# Patient Record
Sex: Male | Born: 1995 | Race: White | Hispanic: No | Marital: Single | Smoking: Former smoker
Health system: Southern US, Community
[De-identification: ages and names within clinical notes are randomized; demographics above are authoritative.]

## PROBLEM LIST (undated history)

## (undated) DIAGNOSIS — F909 Attention-deficit hyperactivity disorder, unspecified type: Secondary | ICD-10-CM

## (undated) HISTORY — DX: Attention-deficit hyperactivity disorder, unspecified type: F90.9

---
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
## 2014-02-17 ENCOUNTER — Encounter (HOSPITAL_COMMUNITY): Payer: Self-pay | Admitting: Emergency Medicine

## 2014-02-17 ENCOUNTER — Emergency Department (HOSPITAL_COMMUNITY)
Admission: EM | Admit: 2014-02-17 | Discharge: 2014-02-17 | Disposition: A | Discharge status: Home / Self Care | Payer: 59 | Source: Home / Self Care | Attending: Family Medicine | Admitting: Family Medicine

## 2014-02-17 DIAGNOSIS — F988 Other specified behavioral and emotional disorders with onset usually occurring in childhood and adolescence: Secondary | ICD-10-CM

## 2014-02-17 NOTE — Discharge Instructions (Signed)
Thank you for coming in today. Follow up with Dr. Conley RollsLe at the [address] location on Friday at 11:15 am. Phone number is [phone] Try to get records from your old doctor's office about your ADHD.    Attention Deficit Hyperactivity Disorder Attention deficit hyperactivity disorder (ADHD) is a problem with behavior issues based on the way the brain functions (neurobehavioral disorder). It is a common reason for behavior and academic problems in school. SYMPTOMS  There are 3 types of ADHD. The 3 types and some of the symptoms include:  Inattentive  Gets bored or distracted easily.  Loses or forgets things. Forgets to hand in homework.  Has trouble organizing or completing tasks.  Difficulty staying on task.  An inability to organize daily tasks and school work.  Leaving projects, chores, or homework unfinished.  Trouble paying attention or responding to details. Careless mistakes.  Difficulty following directions. Often seems like is not listening.  Dislikes activities that require sustained attention (like chores or homework).  Hyperactive-impulsive  Feels like it is impossible to sit still or stay in a seat. Fidgeting with hands and feet.  Trouble waiting turn.  Talking too much or out of turn. Interruptive.  Speaks or acts impulsively.  Aggressive, disruptive behavior.  Constantly busy or on the go, noisy.  Often leaves seat when they are expected to remain seated.  Often runs or climbs where it is not appropriate, or feels very restless.  Combined  Has symptoms of both of the above. Often children with ADHD feel discouraged about themselves and with school. They often perform well below their abilities in school. As children get older, the excess motor activities can calm down, but the problems with paying attention and staying organized persist. Most children do not outgrow ADHD but with good treatment can learn to cope with the symptoms. DIAGNOSIS    When ADHD is suspected, the diagnosis should be made by professionals trained in ADHD. This professional will collect information about the individual suspected of having ADHD. Information must be collected from various settings where the person lives, works, or attends school.  Diagnosis will include:  Confirming symptoms began in childhood.  Ruling out other reasons for the child's behavior.  The health care providers will check with the child's school and check their medical records.  They will talk to teachers and parents.  Behavior rating scales for the child will be filled out by those dealing with the child on a daily basis. A diagnosis is made only after all information has been considered. TREATMENT  Treatment usually includes behavioral treatment, tutoring or extra support in school, and stimulant medicines. Because of the way a person's brain works with ADHD, these medicines decrease impulsivity and hyperactivity and increase attention. This is different than how they would work in a person who does not have ADHD. Other medicines used include antidepressants and certain blood pressure medicines. Most experts agree that treatment for ADHD should address all aspects of the person's functioning. Along with medicines, treatment should include structured classroom management at school. Parents should reward good behavior, provide constant discipline, and limit-setting. Tutoring should be available for the child as needed. ADHD is a life-long condition. If untreated, the disorder can have long-term serious effects into adolescence and adulthood. HOME CARE INSTRUCTIONS   Often with ADHD there is a lot of frustration among family members dealing with the condition. Blame and anger are also feelings that are common. In many cases, because the problem affects the family as  a whole, the entire family may need help. A therapist can help the family find better ways to handle the disruptive  behaviors of the person with ADHD and promote change. If the person with ADHD is young, most of the therapist's work is with the parents. Parents will learn techniques for coping with and improving their child's behavior. Sometimes only the child with the ADHD needs counseling. Your health care providers can help you make these decisions.  Children with ADHD may need help learning how to organize. Some helpful tips include:  Keep routines the same every day from wake-up time to bedtime. Schedule all activities, including homework and playtime. Keep the schedule in a place where the person with ADHD will often see it. Mark schedule changes as far in advance as possible.  Schedule outdoor and indoor recreation.  Have a place for everything and keep everything in its place. This includes clothing, backpacks, and school supplies.  Encourage writing down assignments and bringing home needed books. Work with your child's teachers for assistance in organizing school work.  Offer your child a well-balanced diet. Breakfast that includes a balance of whole grains, protein and, fruits or vegetables is especially important for school performance. Children should avoid drinks with caffeine including:  Soft drinks.  Coffee.  Tea.  However, some older children (adolescents) may find these drinks helpful in improving their attention. Because it can also be common for adolescents with ADHD to become addicted to caffeine, talk with your health care provider about what is a safe amount of caffeine intake for your child.  Children with ADHD need consistent rules that they can understand and follow. If rules are followed, give small rewards. Children with ADHD often receive, and expect, criticism. Look for good behavior and praise it. Set realistic goals. Give clear instructions. Look for activities that can foster success and self-esteem. Make time for pleasant activities with your child. Give lots of  affection.  Parents are their children's greatest advocates. Learn as much as possible about ADHD. This helps you become a stronger and better advocate for your child. It also helps you educate your child's teachers and instructors if they feel inadequate in these areas. Parent support groups are often helpful. A national group with local chapters is called Children and Adults with Attention Deficit Hyperactivity Disorder (CHADD). SEEK MEDICAL CARE IF:  Your child has repeated muscle twitches, cough or speech outbursts.  Your child has sleep problems.  Your child has a marked loss of appetite.  Your child develops depression.  Your child has new or worsening behavioral problems.  Your child develops dizziness.  Your child has a racing heart.  Your child has stomach pains.  Your child develops headaches. SEEK IMMEDIATE MEDICAL CARE IF:  Your child has been diagnosed with depression or anxiety and the symptoms seem to be getting worse.  Your child has been depressed and suddenly appears to have increased energy or motivation.  You are worried that your child is having a bad reaction to a medication he or she is taking for ADHD. Document Released: 09/21/2002 Document Revised: 07/22/2013 Document Reviewed: 06/08/2013 [facility] Patient Information 2014 [employer] [place].

## 2014-02-17 NOTE — ED Provider Notes (Signed)
Cole Howard is a 18 y.o. male who presents to Urgent Care today for ADHD. Patient is just finishing his first year at [employer] where he has done poorly. He has a history of ADHD and has been off of his medications as clear. He would like to restart his medication if possible. He denies any SI/HI tics tremors delusions or hallucinations.   Past Medical History  Diagnosis Date  . ADHD (attention deficit hyperactivity disorder)    History  Substance Use Topics  . Smoking status: Never Smoker   . Smokeless tobacco: Not on file  . Alcohol Use: No   ROS as above Medications: No current facility-administered medications for this encounter.   Current Outpatient Prescriptions  Medication Sig Dispense Refill  . benzonatate (TESSALON) 100 MG capsule Take 100 mg by mouth 3 (three) times daily as needed.        . GuanFACINE HCl (INTUNIV) 3 MG TB24 Take by mouth.        . methylphenidate (METADATE CD) 40 MG CR capsule Take 40 mg by mouth every morning.          Exam:  BP 127/66  Pulse 51  Temp(Src) 98.2 F (36.8 C) (Oral)  Resp 18  SpO2 100% Gen: Well NAD Psych: Alert and oriented normal thought process and speech patterns. No tics.  No results found for this or any previous visit (from the past 24 hour(s)). No results found.  Assessment and Plan: 18 y.o. male with ADHD. Unfortunately we're not able to prescribe his medication today. I have scheduled an appointment with her primary care Dr. for Friday. I instructed him to get his records from his old practice faxed to his new Dr.   Discussed warning signs or symptoms. Please see discharge instructions. Patient expresses understanding.    Rodolph BongEvan S Abena Erdman, MD 02/17/14 1031

## 2014-02-17 NOTE — ED Notes (Signed)
Patient reports instructed to come to [facility] for evaluation of adhd .  School counselor suggested patient be evaluated, functioning poorly academically, concerns with anxiety and depression.  Diagnosed and treatment started in second grade.  Noncompliant with seeing physician and eventually thought he had grown out of childhood diagnosis.

## 2014-02-19 ENCOUNTER — Ambulatory Visit (INDEPENDENT_AMBULATORY_CARE_PROVIDER_SITE_OTHER): Payer: 59 | Admitting: Family Medicine

## 2014-02-19 ENCOUNTER — Telehealth: Payer: Self-pay | Admitting: *Deleted

## 2014-02-19 ENCOUNTER — Encounter: Payer: Self-pay | Admitting: Family Medicine

## 2014-02-19 VITALS — BP 100/58 | HR 49 | Temp 98.2°F | Resp 16 | Ht 70.5 in | Wt 177.0 lb

## 2014-02-19 DIAGNOSIS — F909 Attention-deficit hyperactivity disorder, unspecified type: Secondary | ICD-10-CM

## 2014-02-19 MED ORDER — METHYLPHENIDATE HCL ER (CD) 40 MG PO CPCR: 40.0000 mg | ORAL_CAPSULE | ORAL | Status: DC

## 2014-02-19 MED ORDER — GUANFACINE HCL ER 3 MG PO TB24: 1.0000 | ORAL_TABLET | Freq: Every day | ORAL | Status: DC

## 2014-02-19 NOTE — Progress Notes (Signed)
Chief Complaint:  Chief Complaint  Patient presents with  . Establish Care    and restart ADHD medication    HPI: Cole Howard is a 18 y.o. male who is here for ADHD medication refills ADHD dx 2nd grade, Paulita Cradleonna Steadman NP [facility] [place] Was on meds sophomore year He was active in [employer] and functioned better without medicine, and phsycial fitnees of it, he does not  He took Adderall --it made him a zombie He took Vyvanse--kept him up for days, worst HA for days, issues with sleeping  He took Metadate--he had dry mouth During his class he is doing ok and then during exam he cannot focus He was doing well when he took meds with all his classes Metadate in the AM and also Intuniv  Past Medical History  Diagnosis Date  . ADHD (attention deficit hyperactivity disorder)    No past surgical history on file. History   Social History  . Marital Status: Single    Spouse Name: N/A    Number of Children: N/A  . Years of Education: N/A   Social History Main Topics  . Smoking status: Former Smoker -- 1 years    Quit date: 01/13/2014  . Smokeless tobacco: None  . Alcohol Use: No  . Drug Use: No  . Sexual Activity: None   Other Topics Concern  . None   Social History Narrative  . None   Family History  Problem Relation Age of Onset  . Arthritis Mother   . Kidney disease Mother   . Asthma Mother   . Diabetes Maternal Grandmother   . Hyperlipidemia Maternal Grandmother   . Hypertension Maternal Grandmother   . Asthma Paternal Grandfather   . Diabetes Paternal Grandfather   . Hyperlipidemia Maternal Grandfather   . Hypertension Maternal Grandfather    No Known Allergies Prior to Admission medications   Medication Sig Start Date End Date Taking? Authorizing Provider  OVER THE COUNTER MEDICATION as needed.   Yes Historical Provider, MD  benzonatate (TESSALON) 100 MG capsule Take 100 mg by mouth 3 (three) times daily as needed.      Historical Provider, MD   GuanFACINE HCl (INTUNIV) 3 MG TB24 Take by mouth.      Historical Provider, MD  methylphenidate (METADATE CD) 40 MG CR capsule Take 40 mg by mouth every morning.      Historical Provider, MD     ROS: The patient denies fevers, chills, night sweats, unintentional weight loss, chest pain, palpitations, wheezing, dyspnea on exertion, nausea, vomiting, abdominal pain, dysuria, hematuria, melena, numbness, weakness, or tingling.   All other systems have been reviewed and were otherwise negative with the exception of those mentioned in the HPI and as above.    PHYSICAL EXAM: Filed Vitals:   02/19/14 1115  BP: 100/58  Pulse: 49  Temp: 98.2 F (36.8 C)  Resp: 16   Filed Vitals:   02/19/14 1115  Height: 5' 10.5" (1.791 m)  Weight: 177 lb (80.287 kg)   Body mass index is 25.03 kg/(m^2).  General: Alert, no acute distress HEENT:  Normocephalic, atraumatic, oropharynx patent. EOMI, PERRLA Cardiovascular:  Sinusbrady, no rubs murmurs or gallops.  No Carotid bruits, radial pulse intact. No pedal edema.  Respiratory: Clear to auscultation bilaterally.  No wheezes, rales, or rhonchi.  No cyanosis, no use of accessory musculature GI: No organomegaly, abdomen is soft and non-tender, positive bowel sounds.  No masses. Skin: No rashes. Neurologic: Facial musculature  symmetric. Psychiatric: Patient is appropriate throughout our interaction. Lymphatic: No cervical lymphadenopathy Musculoskeletal: Gait intact.   LABS: No results found for this or any previous visit.   EKG/XRAY:   [facility]   ASSESSMENT/PLAN: Encounter Diagnosis  Name Primary?  . ADHD (attention deficit hyperactivity disorder) Yes   Rx Metadate 40 mg and also Intuniv for 1 month [facility] then will refill 2 months more He loses insurance on June 30 so will have to date the last rx for April 12, 2014 to pick up F/u prn  Gross sideeffects, risk and benefits,  and alternatives of medications d/w patient. Patient is aware that all medications have potential sideeffects and we are unable to predict every sideeffect or drug-drug interaction that may occur.  Lenell Antuhao P Le, DO 02/19/2014 1:07 PM

## 2014-02-19 NOTE — Telephone Encounter (Signed)
Faxed authorization for medical records release [facility], per Dr Conley RollsLe.

## 2014-02-24 ENCOUNTER — Other Ambulatory Visit: Payer: Self-pay | Admitting: Family Medicine

## 2014-02-24 ENCOUNTER — Telehealth: Payer: Self-pay | Admitting: Family Medicine

## 2014-02-24 MED ORDER — GUANFACINE HCL ER 3 MG PO TB24: 1.0000 | ORAL_TABLET | Freq: Every day | ORAL | Status: DC

## 2014-02-24 MED ORDER — METHYLPHENIDATE HCL ER (CD) 40 MG PO CPCR: 40.0000 mg | ORAL_CAPSULE | ORAL | Status: AC

## 2014-02-24 MED ORDER — GUANFACINE HCL ER 3 MG PO TB24: 1.0000 | ORAL_TABLET | Freq: Every day | ORAL | Status: AC

## 2014-02-24 MED ORDER — METHYLPHENIDATE HCL ER (CD) 40 MG PO CPCR: 40.0000 mg | ORAL_CAPSULE | ORAL | Status: DC

## 2014-02-24 NOTE — Telephone Encounter (Signed)
Unable to reach patient, so called mom and told her they can pick up forms and rx since I got records I have [facility], I have left a rx for refills for 2 months as I statted in my office note.  He was put on metadate 40 mg CD in the AM and then Intuiv 1 mg daily x 1 week adn then increase Intuniv to 2 mg daily. This was in 01/25/11 Paulita Cradleonna Steadman NP @ [facility]

## 2014-09-30 ENCOUNTER — Encounter (HOSPITAL_COMMUNITY): Payer: Self-pay

## 2014-09-30 ENCOUNTER — Emergency Department (HOSPITAL_COMMUNITY)
Admission: EM | Admit: 2014-09-30 | Discharge: 2014-09-30 | Disposition: A | Discharge status: Home / Self Care | Payer: 59 | Attending: Emergency Medicine | Admitting: Emergency Medicine

## 2014-09-30 DIAGNOSIS — S0990XA Unspecified injury of head, initial encounter: Secondary | ICD-10-CM | POA: Insufficient documentation

## 2014-09-30 DIAGNOSIS — Y9389 Activity, other specified: Secondary | ICD-10-CM | POA: Diagnosis not present

## 2014-09-30 DIAGNOSIS — Y998 Other external cause status: Secondary | ICD-10-CM | POA: Insufficient documentation

## 2014-09-30 DIAGNOSIS — Z79899 Other long term (current) drug therapy: Secondary | ICD-10-CM | POA: Diagnosis not present

## 2014-09-30 DIAGNOSIS — S161XXA Strain of muscle, fascia and tendon at neck level, initial encounter: Secondary | ICD-10-CM | POA: Insufficient documentation

## 2014-09-30 DIAGNOSIS — Y92410 Unspecified street and highway as the place of occurrence of the external cause: Secondary | ICD-10-CM | POA: Insufficient documentation

## 2014-09-30 DIAGNOSIS — Z87891 Personal history of nicotine dependence: Secondary | ICD-10-CM | POA: Diagnosis not present

## 2014-09-30 DIAGNOSIS — F909 Attention-deficit hyperactivity disorder, unspecified type: Secondary | ICD-10-CM | POA: Diagnosis not present

## 2014-09-30 MED ORDER — IBUPROFEN 200 MG PO TABS
600.0000 mg | ORAL_TABLET | Freq: Once | ORAL | Status: AC
  Administered 2014-09-30: 600 mg via ORAL
  Filled 2014-09-30 (×2): qty 1

## 2014-09-30 MED ORDER — OXYCODONE-ACETAMINOPHEN 5-325 MG PO TABS
1.0000 | ORAL_TABLET | Freq: Once | ORAL | Status: AC
  Administered 2014-09-30: 1 via ORAL
  Filled 2014-09-30: qty 1

## 2014-09-30 NOTE — Discharge Instructions (Signed)
Cervical Sprain °A cervical sprain is an injury in the neck in which the strong, fibrous tissues (ligaments) that connect your neck bones stretch or tear. Cervical sprains can range from mild to severe. Severe cervical sprains can cause the neck vertebrae to be unstable. This can lead to damage of the spinal cord and can result in serious nervous system problems. The amount of time it takes for a cervical sprain to get better depends on the cause and extent of the injury. Most cervical sprains heal in 1 to 3 weeks. °CAUSES  °Severe cervical sprains may be caused by:  °· Contact sport injuries (such as from football, rugby, wrestling, hockey, auto racing, gymnastics, diving, martial arts, or boxing).   °· Motor vehicle collisions.   °· Whiplash injuries. This is an injury from a sudden forward and backward whipping movement of the head and neck.  °· Falls.   °Mild cervical sprains may be caused by:  °· Being in an awkward position, such as while cradling a telephone between your ear and shoulder.   °· Sitting in a chair that does not offer proper support.   °· Working at a poorly designed computer station.   °· Looking up or down for long periods of time.   °SYMPTOMS  °· Pain, soreness, stiffness, or a burning sensation in the front, back, or sides of the neck. This discomfort may develop immediately after the injury or slowly, 24 hours or more after the injury.   °· Pain or tenderness directly in the middle of the back of the neck.   °· Shoulder or upper back pain.   °· Limited ability to move the neck.   °· Headache.   °· Dizziness.   °· Weakness, numbness, or tingling in the hands or arms.   °· Muscle spasms.   °· Difficulty swallowing or chewing.   °· Tenderness and swelling of the neck.   °DIAGNOSIS  °Most of the time your health care provider can diagnose a cervical sprain by taking your history and doing a physical exam. Your health care provider will ask about previous neck injuries and any known neck  problems, such as arthritis in the neck. X-rays may be taken to find out if there are any other problems, such as with the bones of the neck. Other tests, such as a CT scan or MRI, may also be needed.  °TREATMENT  °Treatment depends on the severity of the cervical sprain. Mild sprains can be treated with rest, keeping the neck in place (immobilization), and pain medicines. Severe cervical sprains are immediately immobilized. Further treatment is done to help with pain, muscle spasms, and other symptoms and may include: °· Medicines, such as pain relievers, numbing medicines, or muscle relaxants.   °· Physical therapy. This may involve stretching exercises, strengthening exercises, and posture training. Exercises and improved posture can help stabilize the neck, strengthen muscles, and help stop symptoms from returning.   °HOME CARE INSTRUCTIONS  °· Put ice on the injured area.   °¨ Put ice in a plastic bag.   °¨ Place a towel between your skin and the bag.   °¨ Leave the ice on for 15-20 minutes, 3-4 times a day.   °· If your injury was severe, you may have been given a cervical collar to wear. A cervical collar is a two-piece collar designed to keep your neck from moving while it heals. °¨ Do not remove the collar unless instructed by your health care provider. °¨ If you have long hair, keep it outside of the collar. °¨ Ask your health care provider before making any adjustments to your collar. Minor   adjustments may be required over time to improve comfort and reduce pressure on your chin or on the back of your head. °¨ If you are allowed to remove the collar for cleaning or bathing, follow your health care provider's instructions on how to do so safely. °¨ Keep your collar clean by wiping it with mild soap and water and drying it completely. If the collar you have been given includes removable pads, remove them every 1-2 days and hand wash them with soap and water. Allow them to air dry. They should be completely  dry before you wear them in the collar. °¨ If you are allowed to remove the collar for cleaning and bathing, wash and dry the skin of your neck. Check your skin for irritation or sores. If you see any, tell your health care provider. °¨ Do not drive while wearing the collar.   °· Only take over-the-counter or prescription medicines for pain, discomfort, or fever as directed by your health care provider.   °· Keep all follow-up appointments as directed by your health care provider.   °· Keep all physical therapy appointments as directed by your health care provider.   °· Make any needed adjustments to your workstation to promote good posture.   °· Avoid positions and activities that make your symptoms worse.   °· Warm up and stretch before being active to help prevent problems.   °SEEK MEDICAL CARE IF:  °· Your pain is not controlled with medicine.   °· You are unable to decrease your pain medicine over time as planned.   °· Your activity level is not improving as expected.   °SEEK IMMEDIATE MEDICAL CARE IF:  °· You develop any bleeding. °· You develop stomach upset. °· You have signs of an allergic reaction to your medicine.   °· Your symptoms get worse.   °· You develop new, unexplained symptoms.   °· You have numbness, tingling, weakness, or paralysis in any part of your body.   °MAKE SURE YOU:  °· Understand these instructions. °· Will watch your condition. °· Will get help right away if you are not doing well or get worse. °Document Released: 07/29/2007 Document Revised: 10/06/2013 Document Reviewed: 04/08/2013 °[employer]® Patient Information ©2015 [employer]. This information is not intended to replace advice given to you by your health care provider. Make sure you discuss any questions you have with your health care provider. ° °Head Injury °You have received a head injury. It does not appear serious at this time. Headaches and vomiting are common following head injury. It should be easy to awaken from  sleeping. Sometimes it is necessary for you to stay in the emergency department for a while for observation. Sometimes admission to the hospital may be needed. After injuries such as yours, most problems occur within the first 24 hours, but side effects may occur up to 7-10 days after the injury. It is important for you to carefully monitor your condition and contact your health care provider or seek immediate medical care if there is a change in your condition. °WHAT ARE THE TYPES OF HEAD INJURIES? °Head injuries can be as minor as a bump. Some head injuries can be more severe. More severe head injuries include: °· A jarring injury to the brain (concussion). °· A bruise of the brain (contusion). This mean there is bleeding in the brain that can cause swelling. °· A cracked skull (skull fracture). °· Bleeding in the brain that collects, clots, and forms a bump (hematoma). °WHAT CAUSES A HEAD INJURY? °A serious head injury is most likely to   happen to someone who is in a car wreck and is not wearing a seat belt. Other causes of major head injuries include bicycle or motorcycle accidents, sports injuries, and falls. °HOW ARE HEAD INJURIES DIAGNOSED? °A complete history of the event leading to the injury and your current symptoms will be helpful in diagnosing head injuries. Many times, pictures of the brain, such as CT or MRI are needed to see the extent of the injury. Often, an overnight hospital stay is necessary for observation.  °WHEN SHOULD I SEEK IMMEDIATE MEDICAL CARE?  °You should get help right away if: °· You have confusion or drowsiness. °· You feel sick to your stomach (nauseous) or have continued, forceful vomiting. °· You have dizziness or unsteadiness that is getting worse. °· You have severe, continued headaches not relieved by medicine. Only take over-the-counter or prescription medicines for pain, fever, or discomfort as directed by your health care provider. °· You do not have normal function of the  arms or legs or are unable to walk. °· You notice changes in the black spots in the center of the colored part of your eye (pupil). °· You have a clear or bloody fluid coming from your nose or ears. °· You have a loss of vision. °During the next 24 hours after the injury, you must stay with someone who can watch you for the warning signs. This person should contact local emergency services (911 in the [place] if you have seizures, you become unconscious, or you are unable to wake up. °HOW CAN I PREVENT A HEAD INJURY IN THE FUTURE? °The most important factor for preventing major head injuries is avoiding motor vehicle accidents.  To minimize the potential for damage to your head, it is crucial to wear seat belts while riding in motor vehicles. Wearing helmets while bike riding and playing collision sports (like football) is also helpful. Also, avoiding dangerous activities around the house will further help reduce your risk of head injury.  °WHEN CAN I RETURN TO NORMAL ACTIVITIES AND ATHLETICS? °You should be reevaluated by your health care provider before returning to these activities. If you have any of the following symptoms, you should not return to activities or contact sports until 1 week after the symptoms have stopped: °· Persistent headache. °· Dizziness or vertigo. °· Poor attention and concentration. °· Confusion. °· Memory problems. °· Nausea or vomiting. °· Fatigue or tire easily. °· Irritability. °· Intolerant of bright lights or loud noises. °· Anxiety or depression. °· Disturbed sleep. °MAKE SURE YOU:  °· Understand these instructions. °· Will watch your condition. °· Will get help right away if you are not doing well or get worse. °Document Released: 10/01/2005 Document Revised: 10/06/2013 Document Reviewed: 06/08/2013 °[employer]® Patient Information ©2015 [employer]. This information is not intended to replace advice given to you by your health care provider. Make sure you discuss any questions you  have with your health care provider. ° °

## 2014-09-30 NOTE — ED Notes (Signed)
Dr. Kohut at bedside 
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
## 2014-12-02 ENCOUNTER — Ambulatory Visit: Payer: Self-pay | Admitting: Family Medicine

## 2014-12-02 NOTE — Progress Notes (Signed)
NO SHOW for NPV
